# Patient Record
Sex: Female | Born: 1950 | Race: White | Hispanic: No | Marital: Married | Smoking: Former smoker
Health system: Southern US, Community
[De-identification: ages and names within clinical notes are randomized; demographics above are authoritative.]

## PROBLEM LIST (undated history)

## (undated) DIAGNOSIS — I10 Essential (primary) hypertension: Secondary | ICD-10-CM

## (undated) DIAGNOSIS — I2510 Atherosclerotic heart disease of native coronary artery without angina pectoris: Secondary | ICD-10-CM

## (undated) HISTORY — PX: ROTATOR CUFF REPAIR: SHX139

## (undated) HISTORY — PX: CORONARY ARTERY BYPASS GRAFT: SHX141

## (undated) HISTORY — PX: ANKLE SURGERY: SHX546

---

## 2019-02-10 ENCOUNTER — Emergency Department (HOSPITAL_BASED_OUTPATIENT_CLINIC_OR_DEPARTMENT_OTHER)
Admission: EM | Admit: 2019-02-10 | Discharge: 2019-02-10 | Disposition: A | Discharge status: Home / Self Care | Payer: Medicare Other | Attending: Emergency Medicine | Admitting: Emergency Medicine

## 2019-02-10 ENCOUNTER — Emergency Department (HOSPITAL_BASED_OUTPATIENT_CLINIC_OR_DEPARTMENT_OTHER): Payer: Medicare Other

## 2019-02-10 ENCOUNTER — Other Ambulatory Visit: Payer: Self-pay

## 2019-02-10 ENCOUNTER — Encounter (HOSPITAL_BASED_OUTPATIENT_CLINIC_OR_DEPARTMENT_OTHER): Payer: Self-pay

## 2019-02-10 DIAGNOSIS — S82832A Other fracture of upper and lower end of left fibula, initial encounter for closed fracture: Secondary | ICD-10-CM | POA: Diagnosis not present

## 2019-02-10 DIAGNOSIS — I10 Essential (primary) hypertension: Secondary | ICD-10-CM | POA: Insufficient documentation

## 2019-02-10 DIAGNOSIS — Z951 Presence of aortocoronary bypass graft: Secondary | ICD-10-CM | POA: Diagnosis not present

## 2019-02-10 DIAGNOSIS — Z87891 Personal history of nicotine dependence: Secondary | ICD-10-CM | POA: Diagnosis not present

## 2019-02-10 DIAGNOSIS — Y92410 Unspecified street and highway as the place of occurrence of the external cause: Secondary | ICD-10-CM | POA: Insufficient documentation

## 2019-02-10 DIAGNOSIS — Z880 Allergy status to penicillin: Secondary | ICD-10-CM | POA: Diagnosis not present

## 2019-02-10 DIAGNOSIS — I2510 Atherosclerotic heart disease of native coronary artery without angina pectoris: Secondary | ICD-10-CM | POA: Diagnosis not present

## 2019-02-10 DIAGNOSIS — Y999 Unspecified external cause status: Secondary | ICD-10-CM | POA: Insufficient documentation

## 2019-02-10 DIAGNOSIS — Y9389 Activity, other specified: Secondary | ICD-10-CM | POA: Insufficient documentation

## 2019-02-10 DIAGNOSIS — S8992XA Unspecified injury of left lower leg, initial encounter: Secondary | ICD-10-CM | POA: Diagnosis present

## 2019-02-10 HISTORY — DX: Essential (primary) hypertension: I10

## 2019-02-10 HISTORY — DX: Atherosclerotic heart disease of native coronary artery without angina pectoris: I25.10

## 2019-02-10 MED ORDER — IBUPROFEN 400 MG PO TABS
600.0000 mg | ORAL_TABLET | Freq: Once | ORAL | Status: AC
  Administered 2019-02-10: 600 mg via ORAL
  Filled 2019-02-10: qty 1

## 2019-02-10 MED ORDER — HYDROCODONE-ACETAMINOPHEN 5-325 MG PO TABS: 1.0000 | ORAL_TABLET | Freq: Four times a day (QID) | ORAL | 0 refills | Status: DC | PRN

## 2019-02-10 MED ORDER — METHOCARBAMOL 500 MG PO TABS: 500.0000 mg | ORAL_TABLET | Freq: Two times a day (BID) | ORAL | 0 refills | Status: AC
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
## 2019-02-10 NOTE — Discharge Instructions (Addendum)
Please keep boot on your leg until you can follow up with orthopedics. Please call to make an appointment.   Take pain medication as needed. While at home please elevate your leg on 2-3 pillows to reduce swelling and inflammation   Return to the ED for any worsening symptoms including worsening pain, numbness or tingling in your leg, inability to move your foot up and down.
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
## 2019-02-12 ENCOUNTER — Ambulatory Visit: Payer: Medicare Other | Admitting: Orthopedic Surgery

## 2019-02-12 ENCOUNTER — Other Ambulatory Visit: Payer: Self-pay

## 2019-02-12 DIAGNOSIS — S82892A Other fracture of left lower leg, initial encounter for closed fracture: Secondary | ICD-10-CM | POA: Diagnosis not present

## 2019-02-16 ENCOUNTER — Encounter: Payer: Self-pay | Admitting: Orthopedic Surgery

## 2019-02-16 ENCOUNTER — Other Ambulatory Visit: Payer: Self-pay

## 2019-02-16 ENCOUNTER — Ambulatory Visit: Payer: Medicare Other | Admitting: Orthopedic Surgery

## 2019-02-16 ENCOUNTER — Ambulatory Visit: Payer: Self-pay

## 2019-02-16 VITALS — Ht 62.0 in | Wt 144.0 lb

## 2019-02-16 DIAGNOSIS — M25572 Pain in left ankle and joints of left foot: Secondary | ICD-10-CM | POA: Diagnosis not present

## 2019-02-16 MED ORDER — HYDROCODONE-ACETAMINOPHEN 5-325 MG PO TABS: 1.0000 | ORAL_TABLET | Freq: Four times a day (QID) | ORAL | 0 refills | Status: AC | PRN
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
## 2019-02-23 ENCOUNTER — Telehealth: Payer: Self-pay | Admitting: Orthopedic Surgery

## 2019-02-23 ENCOUNTER — Other Ambulatory Visit: Payer: Self-pay | Admitting: Physician Assistant

## 2019-02-23 MED ORDER — OXYCODONE-ACETAMINOPHEN 5-325 MG PO TABS: 1.0000 | ORAL_TABLET | ORAL | 0 refills | Status: DC | PRN

## 2019-02-23 NOTE — Telephone Encounter (Signed)
Patient called.   She is requesting a different pain medicine. She doesn't feel her current one is doing anything.   Call back number: [phone]

## 2019-02-23 NOTE — Telephone Encounter (Signed)
Please advise 

## 2019-02-23 NOTE — Telephone Encounter (Signed)
Spoke with the patient. Her pain is no worse but the hydrocodone has not helped. Pain is focused in the area of the fracture and on her inner thigh. We will try oxycodone instead but she is not to take the hydrocodone at the same time

## 2019-03-09 ENCOUNTER — Other Ambulatory Visit: Payer: Self-pay

## 2019-03-09 ENCOUNTER — Encounter: Payer: Self-pay | Admitting: Orthopedic Surgery

## 2019-03-09 ENCOUNTER — Ambulatory Visit: Payer: Medicare Other | Admitting: Physician Assistant

## 2019-03-09 ENCOUNTER — Ambulatory Visit: Payer: Self-pay

## 2019-03-09 VITALS — Ht 62.0 in | Wt 144.0 lb

## 2019-03-09 DIAGNOSIS — M25572 Pain in left ankle and joints of left foot: Secondary | ICD-10-CM
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
## 2019-03-10 ENCOUNTER — Other Ambulatory Visit: Payer: Self-pay | Admitting: Physician Assistant

## 2019-03-10 MED ORDER — OXYCODONE-ACETAMINOPHEN 5-325 MG PO TABS: 1.0000 | ORAL_TABLET | ORAL | 0 refills | Status: AC | PRN

## 2019-03-31 ENCOUNTER — Ambulatory Visit: Payer: Medicare Other | Admitting: Physician Assistant

## 2019-03-31 ENCOUNTER — Encounter: Payer: Self-pay | Admitting: Orthopedic Surgery

## 2019-03-31 ENCOUNTER — Other Ambulatory Visit: Payer: Self-pay

## 2019-03-31 ENCOUNTER — Ambulatory Visit: Payer: Self-pay

## 2019-03-31 VITALS — Ht 62.0 in | Wt 144.0 lb

## 2019-03-31 DIAGNOSIS — S82892A Other fracture of left lower leg, initial encounter for closed fracture: Secondary | ICD-10-CM | POA: Diagnosis not present
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
## 2019-04-30 ENCOUNTER — Ambulatory Visit: Payer: Medicare Other | Admitting: Physician Assistant

## 2019-04-30 ENCOUNTER — Other Ambulatory Visit: Payer: Self-pay

## 2019-04-30 ENCOUNTER — Encounter: Payer: Self-pay | Admitting: Orthopedic Surgery

## 2019-04-30 ENCOUNTER — Ambulatory Visit: Payer: Self-pay

## 2019-04-30 VITALS — Ht 62.0 in | Wt 144.0 lb

## 2019-04-30 DIAGNOSIS — S82892A Other fracture of left lower leg, initial encounter for closed fracture: Secondary | ICD-10-CM | POA: Diagnosis not present
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
## 2023-04-09 DEATH — deceased
